# Patient Record
Sex: Male | Born: 1995 | Race: Black or African American | Hispanic: No | Marital: Single | Smoking: Current every day smoker
Health system: Southern US, Community
[De-identification: ages and names within clinical notes are randomized; demographics above are authoritative.]

---
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
## 2019-11-28 ENCOUNTER — Emergency Department (HOSPITAL_COMMUNITY)
Admission: EM | Admit: 2019-11-28 | Discharge: 2019-11-28 | Disposition: A | Discharge status: Home / Self Care | Payer: Medicaid - Out of State | Attending: Emergency Medicine | Admitting: Emergency Medicine

## 2019-11-28 ENCOUNTER — Other Ambulatory Visit: Payer: Self-pay

## 2019-11-28 ENCOUNTER — Encounter (HOSPITAL_COMMUNITY): Payer: Self-pay

## 2019-11-28 DIAGNOSIS — R1084 Generalized abdominal pain: Secondary | ICD-10-CM | POA: Diagnosis not present

## 2019-11-28 DIAGNOSIS — R197 Diarrhea, unspecified: Secondary | ICD-10-CM | POA: Diagnosis not present

## 2019-11-28 DIAGNOSIS — F17210 Nicotine dependence, cigarettes, uncomplicated: Secondary | ICD-10-CM | POA: Insufficient documentation

## 2019-11-28 DIAGNOSIS — R112 Nausea with vomiting, unspecified: Secondary | ICD-10-CM | POA: Diagnosis not present

## 2019-11-28 LAB — COMPREHENSIVE METABOLIC PANEL
ALT: 11 U/L (ref 0–44)
AST: 17 U/L (ref 15–41)
Albumin: 4.2 g/dL (ref 3.5–5.0)
Alkaline Phosphatase: 53 U/L (ref 38–126)
Anion gap: 6 (ref 5–15)
BUN: 8 mg/dL (ref 6–20)
CO2: 29 mmol/L (ref 22–32)
Calcium: 9.1 mg/dL (ref 8.9–10.3)
Chloride: 102 mmol/L (ref 98–111)
Creatinine, Ser: 0.89 mg/dL (ref 0.61–1.24)
GFR calc Af Amer: >60 mL/min (ref >60)
GFR calc non Af Amer: >60 mL/min (ref >60)
Glucose, Bld: 87 mg/dL (ref 70–99)
Potassium: 4 mmol/L (ref 3.5–5.1)
Sodium: 137 mmol/L (ref 135–145)
Total Bilirubin: 0.8 mg/dL (ref 0.3–1.2)
Total Protein: 7 g/dL (ref 6.5–8.1)

## 2019-11-28 LAB — CBC
HCT: 45.2 % (ref 39.0–52.0)
Hemoglobin: 15.8 g/dL (ref 13.0–17.0)
MCH: 31.2 pg (ref 26.0–34.0)
MCHC: 35 g/dL (ref 30.0–36.0)
MCV: 89.3 fL (ref 80.0–100.0)
Platelets: 137 10*3/uL — ABNORMAL LOW (ref 150–400)
RBC: 5.06 MIL/uL (ref 4.22–5.81)
RDW: 12.2 % (ref 11.5–15.5)
WBC: 5.6 10*3/uL (ref 4.0–10.5)
nRBC: 0 % (ref 0.0–0.2)

## 2019-11-28 LAB — URINALYSIS, ROUTINE W REFLEX MICROSCOPIC
Bilirubin Urine: NEGATIVE
Glucose, UA: NEGATIVE mg/dL
Hgb urine dipstick: NEGATIVE
Ketones, ur: NEGATIVE mg/dL
Leukocytes,Ua: NEGATIVE
Nitrite: NEGATIVE
Protein, ur: NEGATIVE mg/dL
Specific Gravity, Urine: 1.005 (ref 1.005–1.030)
pH: 8 (ref 5.0–8.0)

## 2019-11-28 LAB — LIPASE, BLOOD: Lipase: 22 U/L (ref 11–51)

## 2019-11-28 MED ORDER — ONDANSETRON HCL 4 MG/2ML IJ SOLN
4.0000 mg | Freq: Once | INTRAMUSCULAR | Status: AC
  Administered 2019-11-28: 4 mg via INTRAVENOUS
  Filled 2019-11-28: qty 2

## 2019-11-28 MED ORDER — KETOROLAC TROMETHAMINE 15 MG/ML IJ SOLN
15.0000 mg | Freq: Once | INTRAMUSCULAR | Status: AC
  Administered 2019-11-28: 15 mg via INTRAVENOUS
  Filled 2019-11-28: qty 1

## 2019-11-28 MED ORDER — ONDANSETRON 4 MG PO TBDP: 4.0000 mg | ORAL_TABLET | Freq: Three times a day (TID) | ORAL | 0 refills | Status: AC | PRN

## 2019-11-28 MED ORDER — FENTANYL CITRATE (PF) 100 MCG/2ML IJ SOLN: 50.0000 ug | Freq: Once | INTRAMUSCULAR | Status: DC

## 2019-11-28 NOTE — ED Triage Notes (Signed)
Patient c/o abdominal pain, emesis, and diarrhea since last night.

## 2019-11-28 NOTE — ED Provider Notes (Signed)
[facility]-EMERGENCY DEPT Provider Note   CSN: [id number] Arrival date & time: 11/28/19  1039     History Chief Complaint  Patient presents with  . Emesis  . Diarrhea  . Abdominal Pain    Bryan Stuart is a 24 y.o. male.  Presents to ER with concern for abdominal pain.  Patient reports that since last night he has been having intermittent abdominal pain nausea and occasional episodes of vomiting.  Also noted 3 loose stools, no blood in stools or vomit.  Pain is generalized, not in particular place.  No fevers.  Dull achy crampy pain.  Tried taking Tums with no relief.  Reports that over the weekend he had heavy alcohol consumption as well as smoking marijuana.  Denies chronic alcohol abuse or other substance abuse.  Denies prior history of abdominal surgeries.  Denies chronic medical conditions.  HPI     History reviewed. No pertinent past medical history.  There are no problems to display for this patient.   History reviewed. No pertinent surgical history.     Family History  Problem Relation Age of Onset  . Healthy Mother   . Healthy Father     Social History   Tobacco Use  . Smoking status: Current Every Day Smoker    Packs/day: 0.15    Types: Cigarettes  . Smokeless tobacco: Never Used  Vaping Use  . Vaping Use: Never used  Substance Use Topics  . Alcohol use: Yes  . Drug use: Never    Home Medications Prior to Admission medications   Medication Sig Start Date End Date Taking? Authorizing Provider  ondansetron (ZOFRAN ODT) 4 MG disintegrating tablet Take 1 tablet (4 mg total) by mouth every 8 (eight) hours as needed for nausea or vomiting. 11/28/19   Milagros Loll, MD    Allergies    Patient has no known allergies.  Review of Systems   Review of Systems  Constitutional: Negative for chills and fever.  HENT: Negative for ear pain and sore throat.   Eyes: Negative for pain and visual disturbance.  Respiratory: Negative  for cough and shortness of breath.   Cardiovascular: Negative for chest pain and palpitations.  Gastrointestinal: Positive for abdominal pain, nausea and vomiting.  Genitourinary: Negative for dysuria and hematuria.  Musculoskeletal: Negative for arthralgias and back pain.  Skin: Negative for color change and rash.  Neurological: Negative for seizures and syncope.  All other systems reviewed and are negative.   Physical Exam Updated Vital Signs BP (!) 130/91   Pulse 75   Temp 98.9 F (37.2 C) (Oral)   Resp 18   Ht 5\' 8"  (1.727 m)   Wt 59 kg   SpO2 99%   BMI 19.77 kg/m   Physical Exam Vitals and nursing note reviewed.  Constitutional:      Appearance: He is well-developed.  HENT:     Head: Normocephalic and atraumatic.  Eyes:     Conjunctiva/sclera: Conjunctivae normal.  Cardiovascular:     Rate and Rhythm: Normal rate and regular rhythm.     Heart sounds: No murmur heard.   Pulmonary:     Effort: Pulmonary effort is normal. No respiratory distress.     Breath sounds: Normal breath sounds.  Abdominal:     General: Bowel sounds are normal.     Palpations: Abdomen is soft.     Tenderness: There is no abdominal tenderness. There is no guarding or rebound. Negative signs include Murphy's sign and McBurney's  sign.  Musculoskeletal:     Cervical back: Neck supple.  Skin:    General: Skin is warm and dry.  Neurological:     Mental Status: He is alert.     ED Results / Procedures / Treatments   Labs (all labs ordered are listed, but only abnormal results are displayed) Labs Reviewed  CBC - Abnormal; Notable for the following components:      Result Value   Platelets 137 (*)    All other components within normal limits  URINALYSIS, ROUTINE W REFLEX MICROSCOPIC - Abnormal; Notable for the following components:   Color, Urine STRAW (*)    All other components within normal limits  LIPASE, BLOOD  COMPREHENSIVE METABOLIC PANEL    EKG None  Radiology No results  found.  Procedures Procedures (including critical care time)  Medications Ordered in ED Medications  ondansetron (ZOFRAN) injection 4 mg (4 mg Intravenous Given 11/28/19 1302)  ketorolac (TORADOL) 15 MG/ML injection 15 mg (15 mg Intravenous Given 11/28/19 1301)    ED Course  I have reviewed the triage vital signs and the nursing notes.  Pertinent labs & imaging results that were available during my care of the patient were reviewed by me and considered in my medical decision making (see chart for details).    MDM Rules/Calculators/A&P                         24 year old male presenting to ER with concern for abdominal pain as well as nausea, vomiting and diarrhea.  On physical exam patient noted to be well-appearing with normal vital signs.  His abdominal exam is reassuring, do not appreciate focal tenderness at McBurney's point and negative Murphy sign.  His basic labs were within normal limits.  He was provided symptomatic control and had resolution of his symptoms.  Suspect related to viral gastritis versus recent heavy alcohol use.  On reassessment, patient still had a reassuring abdominal exam.  I have very low suspicion for acute abdominal process at this time.  I recommended that patient come back to ER for recheck in 24 hours of his abdominal pain and abdominal exam.  Patient demonstrated understanding of these instructions.  Discharged home.    After the discussed management above, the patient was determined to be safe for discharge.  The patient was in agreement with this plan and all questions regarding their care were answered.  ED return precautions were discussed and the patient will return to the ED with any significant worsening of condition.    Final Clinical Impression(s) / ED Diagnoses Final diagnoses:  Generalized abdominal pain    Rx / DC Orders ED Discharge Orders         Ordered    ondansetron (ZOFRAN ODT) 4 MG disintegrating tablet  Every 8 hours PRN         11/28/19 1411           Milagros Loll, MD 11/29/19 [phone]

## 2019-11-28 NOTE — Discharge Instructions (Signed)
Please return to ER tomorrow for recheck regarding your abdominal pain.  If you have persistent pain or worsening pain, you likely will need additional work-up, imaging performed.  If you develop worsening pain, vomiting, fever in the meantime, come back.  Recommend taking Zofran as needed for nausea.  Recommend Tylenol and Motrin as needed for pain control.  Recommend follow-up with your primary doctor.

## 2020-08-20 ENCOUNTER — Emergency Department (HOSPITAL_BASED_OUTPATIENT_CLINIC_OR_DEPARTMENT_OTHER)
Admission: EM | Admit: 2020-08-20 | Discharge: 2020-08-21 | Disposition: A | Discharge status: Home / Self Care | Payer: Self-pay | Attending: Emergency Medicine | Admitting: Emergency Medicine

## 2020-08-20 ENCOUNTER — Other Ambulatory Visit: Payer: Self-pay

## 2020-08-20 ENCOUNTER — Encounter (HOSPITAL_BASED_OUTPATIENT_CLINIC_OR_DEPARTMENT_OTHER): Payer: Self-pay

## 2020-08-20 DIAGNOSIS — F17210 Nicotine dependence, cigarettes, uncomplicated: Secondary | ICD-10-CM | POA: Insufficient documentation

## 2020-08-20 DIAGNOSIS — Z20822 Contact with and (suspected) exposure to covid-19: Secondary | ICD-10-CM | POA: Insufficient documentation

## 2020-08-20 DIAGNOSIS — J069 Acute upper respiratory infection, unspecified: Secondary | ICD-10-CM | POA: Insufficient documentation

## 2020-08-20 NOTE — ED Triage Notes (Signed)
Pt came in sore throat , dry cough and body ache x yesterday   Vaccinated

## 2020-08-21 ENCOUNTER — Emergency Department (HOSPITAL_BASED_OUTPATIENT_CLINIC_OR_DEPARTMENT_OTHER): Payer: Medicaid - Out of State | Admitting: Radiology

## 2020-08-21 LAB — RESP PANEL BY RT-PCR (FLU A&B, COVID) ARPGX2
Influenza A by PCR: NEGATIVE
Influenza B by PCR: NEGATIVE
SARS Coronavirus 2 by RT PCR: NEGATIVE

## 2020-08-21 MED ORDER — BENZONATATE 100 MG PO CAPS: 100.0000 mg | ORAL_CAPSULE | Freq: Three times a day (TID) | ORAL | 0 refills | Status: AC

## 2020-08-21 NOTE — ED Provider Notes (Signed)
[facility] EMERGENCY DEPT Provider Note   CSN: [id number] Arrival date & time: 08/20/20  2222     History Chief Complaint  Patient presents with   Sore Throat   Cough    Bryan Stuart is a 25 y.o. male.  Patient states he developed a runny nose, cough, sore throat, body aches and chills yesterday.  His cough is productive of yellow mucus with some blood streaks.  Has some shortness of breath and chest pain with coughing.  Denies any significant headache.  Complains of sore throat, body aches and chills.  Did recently travel to [place].  Is COVID vaccinated.  Denies any sick contacts.  No abdominal pain, vomiting or diarrhea.  No pain with urination or blood in the urine. Took Mucinex at home without relief  The history is provided by the patient.  Sore Throat Pertinent negatives include no chest pain, no abdominal pain, no headaches and no shortness of breath.  Cough Associated symptoms: myalgias, rhinorrhea and sore throat   Associated symptoms: no chest pain, no headaches, no rash and no shortness of breath       History reviewed. No pertinent past medical history.  There are no problems to display for this patient.   History reviewed. No pertinent surgical history.     Family History  Problem Relation Age of Onset   Healthy Mother    Healthy Father     Social History   Tobacco Use   Smoking status: Every Day    Packs/day: 0.15    Pack years: 0.00    Types: Cigarettes   Smokeless tobacco: Never  Vaping Use   Vaping Use: Never used  Substance Use Topics   Alcohol use: Yes   Drug use: Never    Home Medications Prior to Admission medications   Medication Sig Start Date End Date Taking? Authorizing Provider  ondansetron (ZOFRAN ODT) 4 MG disintegrating tablet Take 1 tablet (4 mg total) by mouth every 8 (eight) hours as needed for nausea or vomiting. 11/28/19   Milagros Loll, MD    Allergies    Patient has no known  allergies.  Review of Systems   Review of Systems  Constitutional:  Positive for activity change and appetite change. Negative for fatigue.  HENT:  Positive for congestion, rhinorrhea and sore throat.   Respiratory:  Positive for cough and chest tightness. Negative for shortness of breath.   Cardiovascular:  Negative for chest pain.  Gastrointestinal:  Negative for abdominal pain, nausea and vomiting.  Genitourinary:  Negative for dysuria and hematuria.  Musculoskeletal:  Positive for arthralgias and myalgias.  Skin:  Negative for rash.  Neurological:  Negative for dizziness, weakness and headaches.   all other systems are negative except as noted in the HPI and PMH.   Physical Exam Updated Vital Signs BP 121/81 (BP Location: Right Arm)   Pulse 67   Temp 98 F (36.7 C) (Oral)   Resp 18   Ht 5\' 8"  (1.727 m)   Wt 59 kg   SpO2 98%   BMI 19.77 kg/m   Physical Exam Vitals and nursing note reviewed.  Constitutional:      General: He is not in acute distress.    Appearance: Normal appearance. He is well-developed. He is not ill-appearing.  HENT:     Head: Normocephalic and atraumatic.     Right Ear: Tympanic membrane normal.     Left Ear: Tympanic membrane normal.     Mouth/Throat:  Pharynx: No oropharyngeal exudate.     Comments: No asymmetry.  Uvula is midline Eyes:     Conjunctiva/sclera: Conjunctivae normal.     Pupils: Pupils are equal, round, and reactive to light.  Neck:     Comments: No meningismus. Cardiovascular:     Rate and Rhythm: Normal rate and regular rhythm.     Heart sounds: Normal heart sounds. No murmur heard. Pulmonary:     Effort: Pulmonary effort is normal. No respiratory distress.     Breath sounds: Normal breath sounds.  Chest:     Chest wall: No tenderness.  Abdominal:     Palpations: Abdomen is soft.     Tenderness: There is no abdominal tenderness. There is no guarding or rebound.  Musculoskeletal:        General: No tenderness. Normal  range of motion.     Cervical back: Normal range of motion and neck supple.  Skin:    General: Skin is warm.     Capillary Refill: Capillary refill takes less than 2 seconds.  Neurological:     General: No focal deficit present.     Mental Status: He is alert and oriented to person, place, and time. Mental status is at baseline.     Cranial Nerves: No cranial nerve deficit.     Motor: No abnormal muscle tone.     Coordination: Coordination normal.     Comments: No ataxia on finger to nose bilaterally. No pronator drift. 5/5 strength throughout. CN 2-12 intact.Equal grip strength. Sensation intact.   Psychiatric:        Behavior: Behavior normal.    ED Results / Procedures / Treatments   Labs (all labs ordered are listed, but only abnormal results are displayed) Labs Reviewed  RESP PANEL BY RT-PCR (FLU A&B, COVID) ARPGX2    EKG None  Radiology DG Chest 2 View  Result Date: 08/21/2020 CLINICAL DATA:  Cough EXAM: CHEST - 2 VIEW COMPARISON:  None. FINDINGS: The heart size and mediastinal contours are within normal limits. Both lungs are clear. The visualized skeletal structures are unremarkable. IMPRESSION: Negative. Electronically Signed   By: Charlett Nose M.D.   On: 08/21/2020 00:56    Procedures Procedures   Medications Ordered in ED Medications - No data to display  ED Course  I have reviewed the triage vital signs and the nursing notes.  Pertinent labs & imaging results that were available during my care of the patient were reviewed by me and considered in my medical decision making (see chart for details).    MDM Rules/Calculators/A&P                         URI symptoms for the past 2 days.  No hypoxia or increased work of breathing.  COVID testing is negative. CXR negative.   Results discussed with patient.  COVID testing is negative but COVID is still a possibility as he is only been sick for 2 days.  Advised quarantine, supportive care for cough and  antipyretics.  Oral hydration at home.  Follow-up with PCP.  Return to the ED with difficulty breathing, chest pain, or any other concerns.  Bryan Stuart was evaluated in Emergency Department on 08/21/2020 for the symptoms described in the history of present illness. He was evaluated in the context of the global COVID-19 pandemic, which necessitated consideration that the patient might be at risk for infection with the SARS-CoV-2 virus that causes COVID-19. Institutional protocols and  algorithms that pertain to the evaluation of patients at risk for COVID-19 are in a state of rapid change based on information released by regulatory bodies including the CDC and federal and state organizations. These policies and algorithms were followed during the patient's care in the ED.  Final Clinical Impression(s) / ED Diagnoses Final diagnoses:  Viral URI with cough    Rx / DC Orders ED Discharge Orders     None        Patra Gherardi, Jeannett Senior, MD 08/21/20 [phone]

## 2020-08-21 NOTE — Discharge Instructions (Addendum)
Your COVID test today is negative but COVID is still a possibility and should keep her self quarantined while you are feeling ill.  Use Tylenol or Motrin as needed for aches and fever.  Follow-up with your doctor.  Return to the ED with chest pain, shortness of breath, or any other concerns

## 2020-12-25 ENCOUNTER — Encounter (HOSPITAL_BASED_OUTPATIENT_CLINIC_OR_DEPARTMENT_OTHER): Payer: Self-pay | Admitting: Emergency Medicine

## 2020-12-25 ENCOUNTER — Other Ambulatory Visit: Payer: Self-pay

## 2020-12-25 ENCOUNTER — Emergency Department (HOSPITAL_BASED_OUTPATIENT_CLINIC_OR_DEPARTMENT_OTHER)
Admission: EM | Admit: 2020-12-25 | Discharge: 2020-12-25 | Disposition: A | Discharge status: Home / Self Care | Payer: Self-pay | Attending: Emergency Medicine | Admitting: Emergency Medicine

## 2020-12-25 DIAGNOSIS — F17210 Nicotine dependence, cigarettes, uncomplicated: Secondary | ICD-10-CM | POA: Insufficient documentation

## 2020-12-25 DIAGNOSIS — J069 Acute upper respiratory infection, unspecified: Secondary | ICD-10-CM | POA: Insufficient documentation

## 2020-12-25 DIAGNOSIS — Z20822 Contact with and (suspected) exposure to covid-19: Secondary | ICD-10-CM | POA: Insufficient documentation

## 2020-12-25 LAB — RESP PANEL BY RT-PCR (FLU A&B, COVID) ARPGX2
Influenza A by PCR: NEGATIVE
Influenza B by PCR: NEGATIVE
SARS Coronavirus 2 by RT PCR: NEGATIVE

## 2020-12-25 LAB — GROUP A STREP BY PCR: Group A Strep by PCR: NOT DETECTED

## 2020-12-25 MED ORDER — IBUPROFEN 400 MG PO TABS
600.0000 mg | ORAL_TABLET | Freq: Once | ORAL | Status: AC
  Administered 2020-12-25: 600 mg via ORAL
  Filled 2020-12-25: qty 1

## 2020-12-25 NOTE — ED Triage Notes (Signed)
   Patient comes in with URI symptoms and fever that has been going on for two days.  Patient states he has generalized body aches and has been taking mucinex at home.  Last dose before midnight last night.  Patient endorses productive cough, headache, and sinus pressure.  Pain 6/10.

## 2020-12-25 NOTE — Discharge Instructions (Signed)
If you develop high fever, severe cough or cough with blood, trouble breathing, severe headache, neck pain/stiffness, vomiting, or any other new/concerning symptoms then return to the ER for evaluation  

## 2020-12-25 NOTE — ED Notes (Signed)
ED Provider at bedside. 

## 2020-12-25 NOTE — ED Provider Notes (Signed)
[facility] EMERGENCY DEPT Provider Note   CSN: [id number] Arrival date & time: 12/25/20  0640     History Chief Complaint  Patient presents with   Cough   Generalized Body Aches    Bryan Stuart is a 25 y.o. male.  HPI 25 year old male presents with URI symptoms for 2 days.  He describes cough, congestion, sore throat, head cold, body aches.  Subjective fever.  Has taken Mucinex.  He denies any significant past medical history.  Feels like his chest is heavy with congestion.  Does not really feel short of breath.  History reviewed. No pertinent past medical history.  There are no problems to display for this patient.   History reviewed. No pertinent surgical history.     Family History  Problem Relation Age of Onset   Healthy Mother    Healthy Father     Social History   Tobacco Use   Smoking status: Every Day    Packs/day: 0.15    Types: Cigarettes   Smokeless tobacco: Never  Vaping Use   Vaping Use: Never used  Substance Use Topics   Alcohol use: Yes   Drug use: Never    Home Medications Prior to Admission medications   Medication Sig Start Date End Date Taking? Authorizing Provider  benzonatate (TESSALON) 100 MG capsule Take 1 capsule (100 mg total) by mouth every 8 (eight) hours. 08/21/20   Rancour, Jeannett Senior, MD  ondansetron (ZOFRAN ODT) 4 MG disintegrating tablet Take 1 tablet (4 mg total) by mouth every 8 (eight) hours as needed for nausea or vomiting. 11/28/19   Milagros Loll, MD    Allergies    Patient has no known allergies.  Review of Systems   Review of Systems  Constitutional:  Positive for fever.  HENT:  Positive for congestion, sinus pressure and sore throat.   Respiratory:  Positive for cough.   Musculoskeletal:  Positive for myalgias.  Neurological:  Negative for headaches.   Physical Exam Updated Vital Signs BP 118/88 (BP Location: Right Arm)   Pulse 82   Temp 98.1 F (36.7 C) (Oral)   Resp 20   Ht 5'  8" (1.727 m)   Wt 63.5 kg   SpO2 100%   BMI 21.29 kg/m   Physical Exam Vitals and nursing note reviewed.  Constitutional:      General: He is not in acute distress.    Appearance: He is well-developed. He is not ill-appearing or diaphoretic.  HENT:     Head: Normocephalic and atraumatic.     Right Ear: External ear normal.     Left Ear: External ear normal.     Nose: Nose normal.     Mouth/Throat:     Pharynx: Oropharynx is clear. No oropharyngeal exudate.  Eyes:     General:        Right eye: No discharge.        Left eye: No discharge.  Cardiovascular:     Rate and Rhythm: Normal rate and regular rhythm.     Heart sounds: Normal heart sounds.  Pulmonary:     Effort: Pulmonary effort is normal.     Breath sounds: Normal breath sounds. No wheezing, rhonchi or rales.  Abdominal:     Palpations: Abdomen is soft.     Tenderness: There is no abdominal tenderness.  Musculoskeletal:     Cervical back: Neck supple.  Skin:    General: Skin is warm and dry.  Neurological:     Mental  Status: He is alert.  Psychiatric:        Mood and Affect: Mood is not anxious.    ED Results / Procedures / Treatments   Labs (all labs ordered are listed, but only abnormal results are displayed) Labs Reviewed  GROUP A STREP BY PCR  RESP PANEL BY RT-PCR (FLU A&B, COVID) ARPGX2    EKG None  Radiology No results found.  Procedures Procedures   Medications Ordered in ED Medications  ibuprofen (ADVIL) tablet 600 mg (has no administration in time range)    ED Course  I have reviewed the triage vital signs and the nursing notes.  Pertinent labs & imaging results that were available during my care of the patient were reviewed by me and considered in my medical decision making (see chart for details).    MDM Rules/Calculators/A&P                           Patient is well-appearing.  Sounds like he has a viral URI.  Could be milder versions of COVID versus flu.  Strep test obtained  by nursing staff triage protocols, and it is negative.  Will recommend ibuprofen and Tylenol for pain.  He has clear lungs, normal oxygenation and normal vital signs are highly doubt bacterial infection/pneumonia.  Discharged home with return precautions. Final Clinical Impression(s) / ED Diagnoses Final diagnoses:  Viral upper respiratory infection    Rx / DC Orders ED Discharge Orders     None        Pricilla Loveless, MD 12/25/20 [phone]

## 2022-01-22 IMAGING — DX DG CHEST 2V
1 series · 1 of 1 positions shown · non-contrast
Comparison: None.

CLINICAL DATA: Cough

EXAM:
CHEST - 2 VIEW

[chest pa]
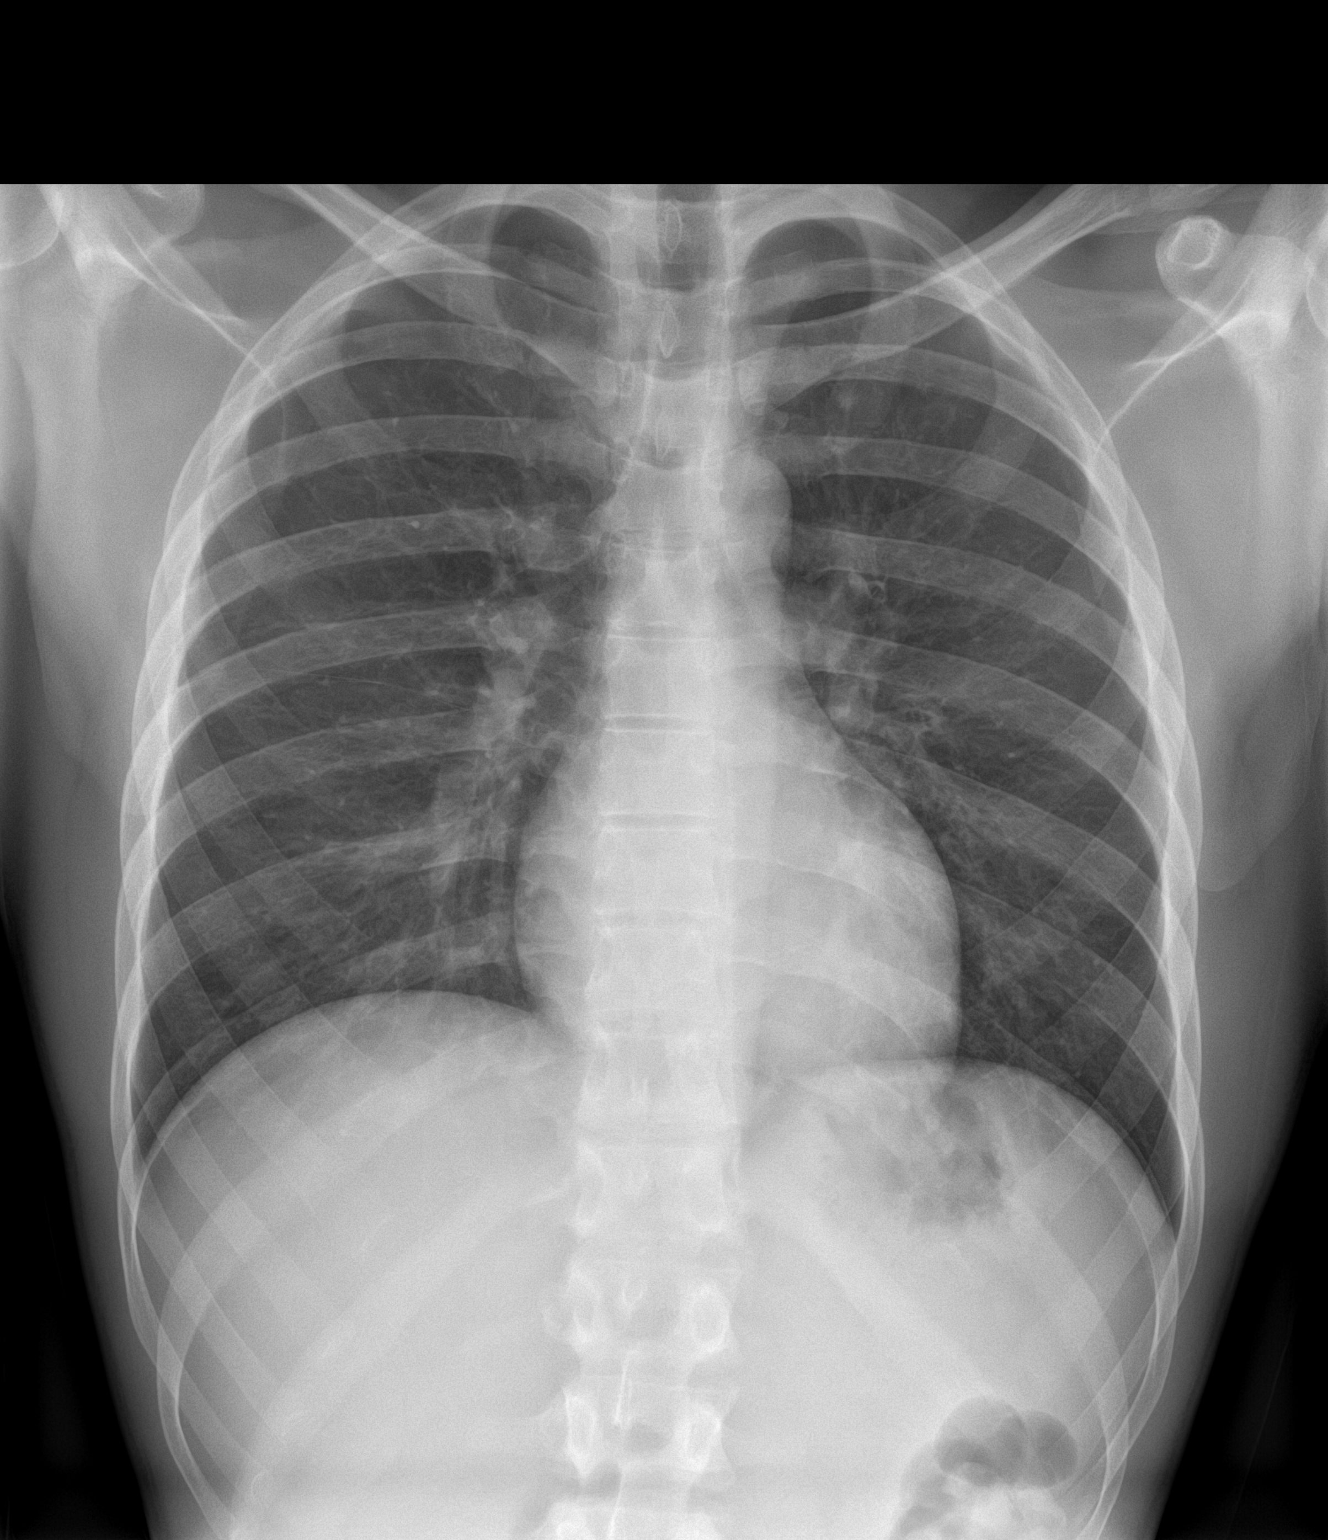

[1 of 1 positions shown; findings below may reference images not displayed]

FINDINGS: The heart size and mediastinal contours are within normal limits.
Both lungs are clear. The visualized skeletal structures are
unremarkable.
IMPRESSION: Negative.
